# Patient Record
Sex: Female | Born: 1935 | Race: White | Hispanic: No | Marital: Married
Health system: Southern US, Community
[De-identification: ages and names within clinical notes are randomized; demographics above are authoritative.]

---

## 2003-05-11 ENCOUNTER — Other Ambulatory Visit: Payer: Self-pay

## 2003-08-23 ENCOUNTER — Other Ambulatory Visit: Payer: Self-pay

## 2004-03-22 ENCOUNTER — Ambulatory Visit: Payer: Self-pay | Admitting: Oncology

## 2004-04-04 ENCOUNTER — Ambulatory Visit: Payer: Self-pay | Admitting: Oncology

## 2004-04-16 ENCOUNTER — Ambulatory Visit: Payer: Self-pay

## 2004-08-08 ENCOUNTER — Ambulatory Visit: Payer: Self-pay | Admitting: Internal Medicine

## 2004-09-19 ENCOUNTER — Ambulatory Visit: Payer: Self-pay | Admitting: Oncology

## 2004-10-03 ENCOUNTER — Ambulatory Visit: Payer: Self-pay | Admitting: Oncology

## 2004-12-30 ENCOUNTER — Ambulatory Visit: Payer: Self-pay

## 2005-03-20 ENCOUNTER — Ambulatory Visit: Payer: Self-pay | Admitting: Oncology

## 2005-04-04 ENCOUNTER — Ambulatory Visit: Payer: Self-pay | Admitting: Oncology

## 2005-08-21 ENCOUNTER — Ambulatory Visit: Payer: Self-pay | Admitting: Oncology

## 2005-09-16 ENCOUNTER — Ambulatory Visit: Payer: Self-pay | Admitting: Oncology

## 2005-10-03 ENCOUNTER — Ambulatory Visit: Payer: Self-pay | Admitting: Oncology

## 2006-03-17 ENCOUNTER — Ambulatory Visit: Payer: Self-pay | Admitting: Oncology

## 2006-04-04 ENCOUNTER — Ambulatory Visit: Payer: Self-pay | Admitting: Oncology

## 2006-08-04 ENCOUNTER — Ambulatory Visit: Payer: Self-pay | Admitting: Oncology

## 2006-09-01 ENCOUNTER — Ambulatory Visit: Payer: Self-pay | Admitting: Oncology

## 2006-09-03 ENCOUNTER — Ambulatory Visit: Payer: Self-pay | Admitting: Oncology

## 2006-10-05 ENCOUNTER — Ambulatory Visit: Payer: Self-pay | Admitting: Oncology

## 2007-02-03 ENCOUNTER — Ambulatory Visit: Payer: Self-pay | Admitting: Internal Medicine

## 2007-03-02 ENCOUNTER — Ambulatory Visit: Payer: Self-pay | Admitting: Internal Medicine

## 2007-03-06 ENCOUNTER — Ambulatory Visit: Payer: Self-pay | Admitting: Internal Medicine

## 2007-05-06 ENCOUNTER — Ambulatory Visit: Payer: Self-pay | Admitting: Oncology

## 2007-06-06 ENCOUNTER — Ambulatory Visit: Payer: Self-pay | Admitting: Oncology

## 2007-09-03 ENCOUNTER — Ambulatory Visit: Payer: Self-pay | Admitting: Oncology

## 2007-09-08 ENCOUNTER — Ambulatory Visit: Payer: Self-pay | Admitting: Oncology

## 2007-10-04 ENCOUNTER — Ambulatory Visit: Payer: Self-pay | Admitting: Oncology

## 2007-11-03 ENCOUNTER — Ambulatory Visit: Payer: Self-pay | Admitting: Oncology

## 2008-03-05 ENCOUNTER — Ambulatory Visit: Payer: Self-pay | Admitting: Oncology

## 2008-03-15 ENCOUNTER — Ambulatory Visit: Payer: Self-pay | Admitting: Oncology

## 2008-04-04 ENCOUNTER — Ambulatory Visit: Payer: Self-pay | Admitting: Oncology

## 2008-09-02 ENCOUNTER — Ambulatory Visit: Payer: Self-pay | Admitting: Oncology

## 2008-09-11 ENCOUNTER — Ambulatory Visit: Payer: Self-pay | Admitting: Oncology

## 2008-10-03 ENCOUNTER — Ambulatory Visit: Payer: Self-pay | Admitting: Oncology

## 2008-11-02 ENCOUNTER — Ambulatory Visit: Payer: Self-pay | Admitting: Oncology

## 2009-03-05 ENCOUNTER — Ambulatory Visit: Payer: Self-pay | Admitting: Oncology

## 2009-03-19 ENCOUNTER — Ambulatory Visit: Payer: Self-pay | Admitting: Oncology

## 2009-04-04 ENCOUNTER — Ambulatory Visit: Payer: Self-pay | Admitting: Oncology

## 2009-09-13 ENCOUNTER — Ambulatory Visit: Payer: Self-pay | Admitting: Gastroenterology

## 2009-10-11 ENCOUNTER — Ambulatory Visit: Payer: Self-pay | Admitting: Oncology

## 2009-11-22 ENCOUNTER — Ambulatory Visit: Payer: Self-pay | Admitting: Oncology

## 2009-11-23 LAB — CANCER ANTIGEN 27.29: CA 27.29: 15.2 U/mL

## 2009-12-03 ENCOUNTER — Ambulatory Visit: Payer: Self-pay | Admitting: Oncology

## 2009-12-06 ENCOUNTER — Ambulatory Visit: Payer: Self-pay | Admitting: Oncology

## 2009-12-17 LAB — PATHOLOGY REPORT

## 2009-12-27 ENCOUNTER — Ambulatory Visit: Payer: Self-pay | Admitting: Surgery

## 2010-01-01 ENCOUNTER — Ambulatory Visit: Payer: Self-pay | Admitting: Surgery

## 2010-01-03 ENCOUNTER — Ambulatory Visit: Payer: Self-pay | Admitting: Oncology

## 2010-02-02 ENCOUNTER — Ambulatory Visit: Payer: Self-pay | Admitting: Oncology

## 2010-02-16 LAB — CANCER ANTIGEN 27.29: CA 27.29: 14.3 U/mL (ref 0.0–38.6)

## 2010-03-05 ENCOUNTER — Ambulatory Visit: Payer: Self-pay | Admitting: Oncology

## 2010-03-09 LAB — CANCER ANTIGEN 27.29: CA 27.29: 14.3 U/mL (ref 0.0–38.6)

## 2010-04-04 ENCOUNTER — Ambulatory Visit: Payer: Self-pay | Admitting: Oncology

## 2010-05-03 ENCOUNTER — Inpatient Hospital Stay: Payer: Self-pay | Admitting: Internal Medicine

## 2010-05-05 ENCOUNTER — Ambulatory Visit: Payer: Self-pay | Admitting: Oncology

## 2010-06-05 ENCOUNTER — Ambulatory Visit: Payer: Self-pay | Admitting: Oncology

## 2010-06-20 ENCOUNTER — Ambulatory Visit: Payer: Self-pay | Admitting: Oncology

## 2010-07-04 ENCOUNTER — Ambulatory Visit: Payer: Self-pay | Admitting: Oncology

## 2010-08-04 ENCOUNTER — Ambulatory Visit: Payer: Self-pay | Admitting: Oncology

## 2010-09-03 ENCOUNTER — Ambulatory Visit: Payer: Self-pay | Admitting: Oncology

## 2010-10-04 ENCOUNTER — Ambulatory Visit: Payer: Self-pay | Admitting: Oncology

## 2010-10-29 ENCOUNTER — Ambulatory Visit: Payer: Self-pay | Admitting: Oncology

## 2010-11-03 ENCOUNTER — Ambulatory Visit: Payer: Self-pay | Admitting: Oncology

## 2010-12-20 ENCOUNTER — Ambulatory Visit: Payer: Self-pay | Admitting: Oncology

## 2010-12-21 LAB — CANCER ANTIGEN 27.29: CA 27.29: 17.8 U/mL

## 2011-01-04 ENCOUNTER — Ambulatory Visit: Payer: Self-pay | Admitting: Oncology

## 2011-02-01 LAB — CANCER ANTIGEN 27.29: CA 27.29: 15.8 U/mL (ref 0.0–38.6)

## 2011-02-03 ENCOUNTER — Ambulatory Visit: Payer: Self-pay | Admitting: Oncology

## 2011-04-05 ENCOUNTER — Ambulatory Visit: Payer: Self-pay | Admitting: Oncology

## 2011-04-07 ENCOUNTER — Ambulatory Visit: Payer: Self-pay

## 2011-04-13 ENCOUNTER — Inpatient Hospital Stay: Payer: Self-pay | Admitting: Specialist

## 2011-05-05 ENCOUNTER — Ambulatory Visit: Payer: Self-pay | Admitting: Oncology

## 2011-05-06 ENCOUNTER — Ambulatory Visit: Payer: Self-pay | Admitting: Oncology

## 2011-05-06 LAB — CANCER ANTIGEN 27.29: CA 27.29: 20.5 U/mL (ref 0.0–38.6)

## 2011-06-12 ENCOUNTER — Ambulatory Visit: Payer: Self-pay | Admitting: Oncology

## 2011-06-17 ENCOUNTER — Ambulatory Visit: Payer: Self-pay | Admitting: Oncology

## 2011-06-23 LAB — CBC CANCER CENTER
Basophil #: 0 x10 3/mm (ref 0.0–0.1)
Basophil %: 0.2 %
Eosinophil %: 0.2 %
HCT: 30.7 % — ABNORMAL LOW (ref 35.0–47.0)
HGB: 9.6 g/dL — ABNORMAL LOW (ref 12.0–16.0)
MCH: 25 pg — ABNORMAL LOW (ref 26.0–34.0)
MCHC: 31.4 g/dL — ABNORMAL LOW (ref 32.0–36.0)
MCV: 80 fL (ref 80–100)
Monocyte #: 0.5 x10 3/mm (ref 0.0–0.7)
Monocyte %: 3.5 %
Platelet: 520 x10 3/mm — ABNORMAL HIGH (ref 150–440)
RBC: 3.85 10*6/uL (ref 3.80–5.20)
WBC: 13.3 x10 3/mm — ABNORMAL HIGH (ref 3.6–11.0)

## 2011-06-23 LAB — COMPREHENSIVE METABOLIC PANEL
Albumin: 2.1 g/dL — ABNORMAL LOW (ref 3.4–5.0)
Anion Gap: 13 (ref 7–16)
BUN: 18 mg/dL (ref 7–18)
Chloride: 104 mmol/L (ref 98–107)
Co2: 24 mmol/L (ref 21–32)
EGFR (African American): >60
EGFR (Non-African Amer.): >60
SGOT(AST): 30 U/L (ref 15–37)
SGPT (ALT): 13 U/L

## 2011-06-27 ENCOUNTER — Inpatient Hospital Stay: Payer: Self-pay | Admitting: Internal Medicine

## 2011-06-27 LAB — URINALYSIS, COMPLETE
Bacteria: NONE SEEN
Glucose,UR: NEGATIVE mg/dL (ref 0–75)
Hyaline Cast: 3
Nitrite: NEGATIVE
Ph: 5 (ref 4.5–8.0)
RBC,UR: 3 /HPF (ref 0–5)
Specific Gravity: 1.021 (ref 1.003–1.030)
WBC UR: 11 /HPF (ref 0–5)

## 2011-06-27 LAB — DIFFERENTIAL
Bands: 8 %
Basophil #: 0 10*3/uL (ref 0.0–0.1)
Comment - H1-Com2: NORMAL
Eosinophil %: 0 %
Lymphocyte #: 1.5 10*3/uL (ref 1.0–3.6)
Monocyte %: 0.2 %
Neutrophil %: 95.5 %
Segmented Neutrophils: 89 %

## 2011-06-27 LAB — COMPREHENSIVE METABOLIC PANEL
Albumin: 2.3 g/dL — ABNORMAL LOW (ref 3.4–5.0)
BUN: 32 mg/dL — ABNORMAL HIGH (ref 7–18)
Bilirubin,Total: 0.3 mg/dL (ref 0.2–1.0)
Chloride: 108 mmol/L — ABNORMAL HIGH (ref 98–107)
Co2: 25 mmol/L (ref 21–32)
Creatinine: 0.74 mg/dL (ref 0.60–1.30)
EGFR (Non-African Amer.): >60
Glucose: 157 mg/dL — ABNORMAL HIGH (ref 65–99)
SGOT(AST): 44 U/L — ABNORMAL HIGH (ref 15–37)
SGPT (ALT): 11 U/L — ABNORMAL LOW
Sodium: 146 mmol/L — ABNORMAL HIGH (ref 136–145)
Total Protein: 7 g/dL (ref 6.4–8.2)

## 2011-06-27 LAB — CBC
HCT: 32.7 % — ABNORMAL LOW (ref 35.0–47.0)
HGB: 10.4 g/dL — ABNORMAL LOW (ref 12.0–16.0)
MCHC: 31.7 g/dL — ABNORMAL LOW (ref 32.0–36.0)
RDW: 19.6 % — ABNORMAL HIGH (ref 11.5–14.5)
WBC: 35.6 10*3/uL — ABNORMAL HIGH (ref 3.6–11.0)

## 2011-06-27 LAB — CK TOTAL AND CKMB (NOT AT ARMC)
CK, Total: 145 U/L (ref 21–215)
CK-MB: 0.8 ng/mL (ref 0.5–3.6)

## 2011-06-28 LAB — CBC WITH DIFFERENTIAL/PLATELET
Basophil %: 0 %
Eosinophil %: 0 %
HCT: 24.6 % — ABNORMAL LOW (ref 35.0–47.0)
HGB: 7.8 g/dL — ABNORMAL LOW (ref 12.0–16.0)
Lymphocyte #: 0.2 10*3/uL — ABNORMAL LOW (ref 1.0–3.6)
MCH: 25.3 pg — ABNORMAL LOW (ref 26.0–34.0)
MCV: 80 fL (ref 80–100)
Monocyte #: 0 10*3/uL (ref 0.0–0.7)
Neutrophil #: 16.7 10*3/uL — ABNORMAL HIGH (ref 1.4–6.5)
Platelet: 237 10*3/uL (ref 150–440)
RBC: 3.08 10*6/uL — ABNORMAL LOW (ref 3.80–5.20)
RDW: 19.4 % — ABNORMAL HIGH (ref 11.5–14.5)

## 2011-06-28 LAB — BASIC METABOLIC PANEL
BUN: 26 mg/dL — ABNORMAL HIGH (ref 7–18)
Creatinine: 0.69 mg/dL (ref 0.60–1.30)
EGFR (Non-African Amer.): >60
Glucose: 222 mg/dL — ABNORMAL HIGH (ref 65–99)
Osmolality: 302 (ref 275–301)
Potassium: 4.7 mmol/L (ref 3.5–5.1)
Sodium: 146 mmol/L — ABNORMAL HIGH (ref 136–145)

## 2011-06-29 LAB — CBC WITH DIFFERENTIAL/PLATELET
Eosinophil #: 0 10*3/uL (ref 0.0–0.7)
HCT: 23.5 % — ABNORMAL LOW (ref 35.0–47.0)
HGB: 7.4 g/dL — ABNORMAL LOW (ref 12.0–16.0)
Lymphocyte %: 14 %
Monocyte %: 0.6 %
Neutrophil #: 4.7 10*3/uL (ref 1.4–6.5)
Neutrophil %: 85.1 %
RDW: 19.2 % — ABNORMAL HIGH (ref 11.5–14.5)
WBC: 5.5 10*3/uL (ref 3.6–11.0)

## 2011-07-01 LAB — URINE CULTURE

## 2011-07-02 LAB — CULTURE, BLOOD (SINGLE)

## 2011-07-04 ENCOUNTER — Ambulatory Visit: Payer: Self-pay | Admitting: Oncology

## 2011-08-04 DEATH — deceased

## 2012-03-20 IMAGING — NM NUCLEAR MEDICINE CARDIAC MULTIPLE UPTAKE GATED ACQUISITION SCAN
4 series · 24 of 24 positions shown · non-contrast
Comparison: none

REASON FOR EXAM: breast CA chemo ABRIANYZAN
COMMENTS:

[Series 1000: ant-gated · 3.30mm/px · 6 of 24 frames shown]
[frame 3/24]
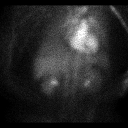
[frame 7/24]
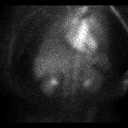
[frame 11/24]
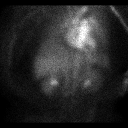
[frame 15/24]
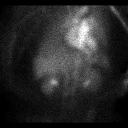
[frame 19/24]
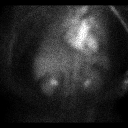
[frame 23/24]
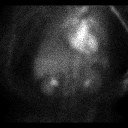

[Series 1000: lao 45-gated (results) · 3.30mm/px · 6 of 24 frames shown]
[frame 3/24]
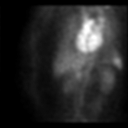
[frame 7/24]
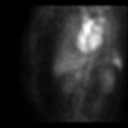
[frame 11/24]
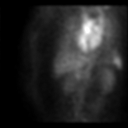
[frame 15/24]
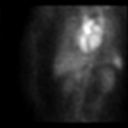
[frame 19/24]
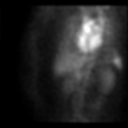
[frame 23/24]
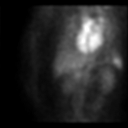

[Series 1000: lao 45-gated · 3.30mm/px · 6 of 24 frames shown]
[frame 3/24]
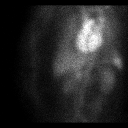
[frame 7/24]
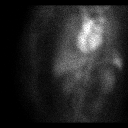
[frame 11/24]
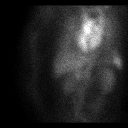
[frame 15/24]
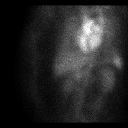
[frame 19/24]
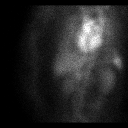
[frame 23/24]
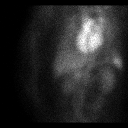

[Series 1000: lao 70-gated · 3.30mm/px · 6 of 24 frames shown]
[frame 3/24]
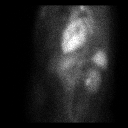
[frame 7/24]
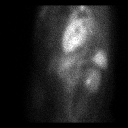
[frame 11/24]
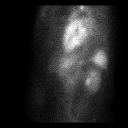
[frame 15/24]
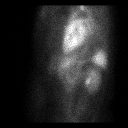
[frame 19/24]
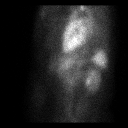
[frame 23/24]
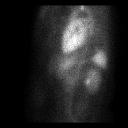

[24 of 24 positions shown; findings below may reference images not displayed]

PROCEDURE:     KNM - KNM REST MUGA SCAN [DATE] OF [DATE]  - [DATE] [DATE] [DATE]  [DATE]

RESULT:     The patient's blood pool was labeled using 23.63 mCi of
technetium 99m labeled pertechnetate with 3 mL of pyrophosphate. The cine
images demonstrated concentric left ventricular wall motion. The ejection
fraction was measured at 56%.
IMPRESSION: Normal left ventricular ejection fraction.

## 2014-08-27 NOTE — Discharge Summary (Signed)
PATIENT NAME:  Annette Watson, Annette Watson MR#:  [id number] DATE OF BIRTH:  [birth year]  DATE OF ADMISSION:  06/27/2011 DATE OF DISCHARGE:  06/30/2011  DISCHARGE DIAGNOSES: 1. Altered mental status with encephalopathy.  2. Leukocytosis.  3. Metastatic breast cancer with pain involving the right chest wall and upper extremity.  4. Type 2 diabetes. 5. Hyperlipidemia.  6. History of previous cerebrovascular accident.   CHIEF COMPLAINT: Talking out of her head.   HISTORY OF PRESENT ILLNESS: Annette Watson is an 81 year old female with a history of metastatic breast cancer who been seen by Dr. Doylene Canninghoksi recently and received an Adriamycin shot. The patient reportedly also had her pain medications changed and her Duragesic was increased to 75 mcg and she was changed to oxycodone instead of Vicodin. The patient reportedly had been sleeping a lot and she also was treated recently for a urinary tract infection. The patient recently had a PET scan that showed a soft tissue mass in the right chest wall with rib involvement. The patient in the ER was noted to have an elevated white cell count of 35,000 with leukocyte esterase in the urinalysis.   PAST MEDICAL HISTORY:  1. Metastatic breast cancer. 2. History of B12 deficiency.  3. Osteoporosis.  4. Gastric polyp. 5. Degenerative disk disease.  6. Hypertension. 7. Prior history of carcinoid. 8. 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  9. Diabetes. 10. Right upper extremity lymphedema. 11. Hyperlipidemia.   PAST SURGICAL HISTORY:  1. Lumpectomy.  2. Appendectomy.  3. Bilateral cataracts.  4. Cholecystectomy. 5. Hysterectomy. 6. Hip surgeries.    PHYSICAL EXAMINATION: VITAL SIGNS: Patient was afebrile, pulse 57, respirations 16, blood pressure 124/57, oxygen saturation 93% on room air. GENERAL: She was not in respiratory distress. However, she appeared to be in significant pain. SKIN: She did have a large decubitus in the  buttock. She had 2+ edema, more so on the right upper extremity. ABDOMEN: Soft, nontender. HEART: S1, S2. LUNGS: Clear to auscultation. NECK: No JVD.   LABORATORY, RADIOLOGICAL AND DIAGNOSTIC DATA: CT scan of the head showed a large area of encephalomalacia in the left cerebrum consistent with old infarct. Hemoglobin 10.4, hematocrit 32.7, troponin was negative. Albumin was low at 2.3, potassium 4.9, sodium 146, creatinine 0.74, BUN 32, glucose 157.   HOSPITAL COURSE: The patient was admitted to [facility] and she was seen in consultation by hospice. She was also seen by hematology/oncology specialist, Dr. Sherrlyn HockPandit. The patient was continued on Fentanyl, Toradol, IV morphine. Discussions were held with the family. She was wanting to be made DO NOT RESUSCITATE, and kept comfortable. She was, therefore, transferred to the hospice home and started on Roxanol for pain along with lorazepam and continued on Duragesic patch and gabapentin. The patient was discharged to the hospice home on the following medications.   DISCHARGE MEDICATIONS:  1. Zofran 4 mg every eight hours for 10 days p.r.n.  2. Meloxicam 15 mg once a day.  3. Amlodipine 5 mg once a day.  4. Lisinopril 40 mg once a day.  5. Glipizide 10 mg b.i.d.  6. Atenolol 50 mg daily.  7. Metformin 500 mg b.i.d.  8. Duragesic patch 50 mcg every 72 hours.  9. Gabapentin 300 mg p.o. t.i.d.  10. Roxanol 20 mg/mL as per standing order. 11. Lorazepam 0.5 to 1 mg p.o. or sublingual every 2 to 4 hours p.r.n. for agitation and anxiety. 12. Prednisone 40 mg taper by 2.5 mg every other day until gone. 13.  NovoLog insulin as per sliding scale.   The patient and family were aware of her overall poor prognosis.    ____________________________ Barbette Reichmann, MD vh:ap D: 07/03/2011 17:26:29 ET T: 07/04/2011 12:03:07 ET JOB#: [id number]  cc: Barbette Reichmann, MD, <Dictator> Barbette Reichmann MD ELECTRONICALLY SIGNED 07/18/2011 13:01

## 2014-08-27 NOTE — Consult Note (Signed)
History of Present Illness:   Reason for Consult Patient was admitted in the hospital with increasing right upper extremity pain.  Patient has progressive carcinoma of breast triple negative disease, failng Abraxane and Adriamycin and radiation therapy    HPI   81 year old lady on a previous history of carcinoma of breast, triple negative disease now progressive disease increasing pain in the right upper extremity.  Patient was admitted with increasing confusion and disorientation.  Poor appetite.  Gradually declining condition.  Patient was on fentanyl patch 75 mcg and oxycodone.  PFSH:   Additional Past Medical and Surgical History Past Medical History  Diabetes controlled with oral medication.  History of carcinoma of appendix.  Hypertension.  Previous history of right sided hemiplegia with residual weakness.  Past Surgical History  Appendectomy for carcinoma.  Surgery for breast cancer.  Family History  No family history of colorectal cancer, breast cancer, or ovarian cancer.  Social History  Does not smoke.  Does not drink.   Review of Systems:   Review of Systems   Gen. status: Patient is in wheelchair.  In distress because of increasing pain in the right shoulder.  Right upper extremity increasing swelling.  Pain is constant dull aching not reviewed by hydrocortisone.  Poor appetite.  Losing weight.  Lungs: Cough and shortness of breath.  Cardiac: No chest pain.  No palpitation.  GI: Poor appetite.  No diarrhea.  Constipation.  GU: No dysuria or hematuria.  Skin: Has noticed multiple new nodules in the chest wall area.  Neurological system: Residual weakness from previous cerebrovascular accident.  Lower extremity no edema.  NURSING NOTES: ED Vital Sign Flow Sheet:   22-Feb-13 06:00    Pulse Pulse: 58    Respirations Respirations: 16    SBP SBP: 132    DBP DBP: 74    Pulse Ox % Pulse Ox %: 93    Source: Room Air   Physical Exam:   Physical Exam Gen. status:  Performance status is 2.LYMPHATICS:   No cervical, axillary, or inguinal lymphadenopathy.  Right upper chest wall area: Multiple new nodules.  Right upper extremity swelling.  No mental the right shoulder is limited.ABDOMINAL:   Positive bowel sounds, nontender, nondistended, soft.  No hepatomegaly, no splenomegaly cardiac: Tachycardia.  Neurological system: Persistent right upper and lower extremity weakness (previous cerebrovascular accident). HEENT:   Normocephalic, atraumatic, eyes anicteric. Mucous membranes moist. No ulcers, lesions, or thrush.  Examination of the remaining 12 systems is noncontributory.     Hip Fracture - Right:    Expressive dysphagia:    Right arm swelling:    Memory changes and confusion:    Swallowing difficulty sometimes:    Diabetes Mellitus, Type II (NIDD):    anemia:    Right breast cancer:    Stroke: 1982   right sided hemiplegia w/residual weakness:    Hypertension:    appendix ca:    cataract surgery both eyes:    Breast Surgery:    Appendectomy:    No Known Allergies:     ondansetron 4 mg tablet: 1 tab(s) orally every 8 hours x 10 days, As Needed- for Nausea, Vomiting , Active, 30, None   oxyCODONE 10 mg tablet: 1 tab(s) orally every 4 hours x 15 days, As Needed- for Pain , Active, 90, None   meloxicam 15 mg tablet: 1 tab(s) orally once a day x 15 days, Active, 15, None   acetaminophen-HYDROcodone 500 mg-5 mg tablet: 1 tab(s) orally every 4 hours x 15  days, As Needed- for Pain , Active, 90, None   amlodipine 5 mg oral tablet: 1  orally once a day (at bedtime) , Active, 0, None   lisinopril 40 mg oral tablet: 1  orally once a day (in the morning) , Active, 0, None   glipiZIDE 10 mg oral tablet: 1  orally 2 times a day , Active, 0, None   simvastatin 40 mg oral tablet: 1  orally once a day (at bedtime) , Active, 0, None   atenolol 50 mg oral tablet: 1 tab(s) orally once a day, Active, 0, None   metformin 500 mg oral tablet: 1  tab(s) orally 2 times a day, Active, 0, None   aspirin 81 mg oral tablet: 1 tab(s) orally once a day, Active, 0, None   Duragesic-50 patch:   every 72 hours, Active, 0, None   gabapentin 300 mg oral capsule: 1 cap(s) orally 3 times a day, Active, 0, None  Cardiac:  22-Feb-13 05:08    CK, Total 145   CPK-MB, Serum 0.8  Routine Chem:  22-Feb-13 05:08    Glucose, Serum 157   BUN 32   Creatinine (comp) 0.74   Sodium, Serum 146   Potassium, Serum 4.9   Chloride, Serum 108   CO2, Serum 25   Calcium (Total), Serum 8.7  Hepatic:  22-Feb-13 05:08    Bilirubin, Total 0.3   Alkaline Phosphatase 57   SGPT (ALT) 11   SGOT (AST) 44   Total Protein, Serum 7.0   Albumin, Serum 2.3  Routine Chem:  22-Feb-13 05:08    Osmolality (calc) 301   eGFR (African American) >60   eGFR (Non-African American) >60   Anion Gap 13  Routine Hem:  22-Feb-13 05:08    WBC (CBC) 35.6   RBC (CBC) 4.06   Hemoglobin (CBC) 10.4   Hematocrit (CBC) 32.7   Platelet Count (CBC) 344   MCV 81   MCH 25.5   MCHC 31.7   RDW 19.6  Cardiac:  22-Feb-13 05:08    Troponin I < 0.02  Routine Hem:  22-Feb-13 05:08    Neutrophil % 95.5   Lymphocyte % 4.3   Monocyte % 0.2   Eosinophil % 0.0   Basophil % 0.0   Neutrophil # 34.0   Lymphocyte # 1.5   Monocyte # 0.1   Eosinophil # 0.0   Basophil # 0.0   Reference Accession# [id number]   Bands 8   Segmented Neutrophils 89   Lymphocytes 3  Routine UA:  22-Feb-13 05:40    Color (UA) Yellow   Clarity (UA) Hazy   Glucose (UA) Negative   Bilirubin (UA) Negative   Ketones (UA) Negative   Specific Gravity (UA) 1.021   Blood (UA) Negative   pH (UA) 5.0   Protein (UA) Negative   Nitrite (UA) Negative   Leukocyte Esterase (UA) Trace   RBC (UA) 3 /HPF   WBC (UA) 11 /HPF   Epithelial Cells (UA) <1 /HPF   Mucous (UA) PRESENT   Budding Yeast (UA) PRESENT   Hyaline Cast (UA) 3 /LPF  Blood Glucose:  22-Feb-13 11:41    POCT Blood Glucose 190    16:33    POCT  Blood Glucose 182    20:16    POCT Blood Glucose 214   Assessment and Plan:  Impression:   1.  Carcinoma of breast recurrent chest wall disease (triple -ve)  increasing pain in the right upper extremity right shoulder.  Multiple new nodules  on the chest  Status post 2nd cycle of Adriamycin chemotherapy without much relief in pain Pain can be due to brachial plexus involvement Confusion and disorientation may be secondary to pain medication versus metastatic disease to brain Discuss with the family.  Will switch to comfort care.  Start IV steroid add Neurontin .  IV Toradol.  Possibility of discharge with hospice next week after pain is controlled.  Discussed with the family and they wanted her to be no code.  Electronic Signatures: Dinah Lupa, Martie Lee (MD)  (Signed [phone] 22:44)  Authored: HISTORY OF PRESENT ILLNESS, PFSH, ROS, NURSING NOTES, PE, PAST MEDICAL HISTORY, ALLERGIES, HOME MEDICATIONS, LABS, ASSESSMENT AND PLAN   Last Updated: 22-Feb-13 22:44 by Jobe Gibbon (MD)

## 2014-08-27 NOTE — H&P (Signed)
PATIENT NAME:  Annette Watson, Annette Watson MR#:  [id number] DATE OF BIRTH:  November 10, 1928  DATE OF ADMISSION:  06/27/2011  PRIMARY CARE PHYSICIAN: Dr. Marcello Fennel  ONCOLOGIST: Dr. Doylene Canning   CHIEF COMPLAINT: Talking out of her head.   HISTORY OF PRESENT ILLNESS: This is an 81 year old female who has been talking out of her head. She had a cancer treatment on Monday with Dr. Doylene Canning, given Adriamycin. On Tuesday received a shot. I am not sure if that is Neupogen or not. As per the husband she has been going downhill since they changed pain medications up to 75 of Duragesic and oxycodone 10 mg instead of Vicodin. She has been sleeping a lot, talking out of her head. She had a recent treatment with a urinary tract infection. The patient is unable to give history secondary to prior history of cerebrovascular accident. She did have a PET scan last week on 06/17/2011 that showed a soft tissue mass right chest wall involvement and rib involvement and that was on 06/17/2011. In the Emergency Room, she was found to be altered mental status, had trace leukocyte esterase on her urinalysis, had an increased white count of 35,000 and hospitalist services were contacted for further evaluation.   PAST MEDICAL HISTORY:  1. Metastatic breast cancer, undergoing chemotherapy status post lumpectomy and radiation in the past. The chemotherapy is for recurrence. 2. History of B12 deficiency.  3. Osteoporosis.  4. Gastric polyp. 5. Degenerative disk disease.  6. Hypertension.  7. Prior history of carcinoid. 8. Cerebrovascular accident with right left-sided paralysis and aphasia. 9. Diabetes.  10. Right upper extremity lymphedema.  11. Hyperlipidemia.   PAST SURGICAL HISTORY:  1. Lumpectomy.  2. Appendectomy.  3. Cataracts bilaterally. 4. Cholecystectomy.  5. Hysterectomy.  6. Hip surgery.   ALLERGIES: No known drug allergies in the computer.   MEDICATIONS:  1. Gabapentin 300 mg t.i.d.  2. Meloxicam 50 mg daily. 3. Duragesic  patch recently increased to 75 mcg daily.  4. Oxycodone 10 mg every four hours p.r.n.  5. Aspirin 81 mg daily. 6. Atenolol 50 mg daily. 7. Lisinopril 40 mg daily. 8. Metformin 500 mg twice a day.  9. Glipizide 10 mg twice a day.  10. Zocor 40 mg daily.  11. Norvasc 5 mg daily.   FAMILY HISTORY: Mother died at old age at 54. Father died at 19 with bone cancer.   SOCIAL HISTORY: Lives with her husband. No smoking, alcohol or drug use   REVIEW OF SYSTEMS: Patient states "no" every review of systems. Unclear if this is true or not. Difficult history with altered mental status and history of aphasia. Going through all the details: CONSTITUTIONAL: No fever, chills, or sweats. EYES: No eye pain. EARS, NOSE, MOUTH, AND THROAT: No hearing loss. No sore throat. No difficulty swallowing. CARDIOVASCULAR: No chest pain. No palpitations. RESPIRATORY: No shortness of breath. No cough. No wheezing. No hemoptysis. GASTROINTESTINAL: No nausea. No vomiting. No abdominal pain, no diarrhea, no constipation, no bright red blood per rectum. No melena. GENITOURINARY: No burning on urination. No hematuria. MUSCULOSKELETAL: No joint pain but as per the husband right arm pain and right chest wall pain. INTEGUMENT: No rashes. NEUROLOGIC: No fainting. PSYCHIATRIC: No anxiety or depression. ENDOCRINE: No thyroid problems. HEMATOLOGIC/LYMPHATIC: History of anemia.     PHYSICAL EXAMINATION:  VITAL SIGNS: Temperature 98.9, pulse 57, respirations 16, blood pressure 124/57, pulse oximetry 93% on room air.   GENERAL: No respiratory distress.   EYES: Conjunctivae and lids normal. Pupils equal, round, and  reactive to light. Able to track my finger with her eyes in all directions.   EARS, NOSE, MOUTH, AND THROAT: Tympanic membranes obscured by wax. Nasal mucosa no erythema. Throat no erythema. No exudate seen. Lips and gums no lesions.   NECK: No JVD. No bruits. No lymphadenopathy. No thyromegaly. No thyroid nodules palpated.    RESPIRATORY: Lungs clear to auscultation No use of accessory muscles to breathe.   CARDIOVASCULAR: S1, S2 normal. No gallops, rubs, or murmurs heard. Carotid upstroke 2+ bilaterally. No bruits.   EXTREMITIES: Dorsalis pedis pulses 2+ bilaterally. Trace edema of the lower extremity.   ABDOMEN: Soft, nontender. No organomegaly/splenomegaly. Normoactive bowel sounds. No masses felt.   LYMPHATIC: No lymph nodes in the neck.   MUSCULOSKELETAL: No clubbing. 2+ edema. No cyanosis.   SKIN: On the buttock she does have a large area that is regular decubitus with some blackish material.   NEUROLOGIC: Cranial nerves II through XII grossly intact. Deep tendon reflexes 1+. Patient is paralyzed on the right side. Able to move extremities on the left side on her own.   PSYCHIATRIC: Answers yes or no questions.   LABORATORY, DIAGNOSTIC AND RADIOLOGICAL DATA: Urinalysis: Trace leukocyte esterase. Glucose 157, BUN 32, creatinine 0.74, sodium 146, potassium 4.9, chloride 108, CO2 25. Liver function tests normal. Albumin low at 23. White blood cell count 35.6, platelet count 344, hemoglobin and hematocrit 10.4 and 32.7. Troponin negative. Chest x-ray looks like a chronic area in the right upper chest. CT scan of the head showed no acute intracranial abnormality. Large area of encephalomalacia in the left cerebrum consistent with old infarct. No EKG done.   ASSESSMENT AND PLAN:  1. Encephalopathy with altered mental status, possibly secondary to change in pain medications recently. Will hold the oxycodone for right now and the gabapentin. Will decrease the Duragesic patch from 75 back down to 50, p.r.n. Vicodin only for pain. Need to rule out metastatic disease to the brain. Will get an MRI of the brain. Could also be infection related with leukocytosis, unclear at this point. The urinalysis only trace leukocyte esterase.  2. Leukocytosis. Need to rule out infection. Will get blood cultures x2. Urine culture.  Empiric Zosyn for right now, one dose of vancomycin until cultures are back.  3. Metastatic breast cancer. Patient is a FULL CODE. Will consult Dr. Doylene Canninghoksi to discuss prognosis.  4. Hypertension. Blood pressure currently stable on usual medications, will continue.  5. Diabetes. With the altered mental status will get a swallow evaluation since I am not sure how much she will eat right now. Will hold the oral medications and just cover with sliding scale.  6. Hyperlipidemia. Continue Zocor.  7. History of cerebrovascular accident. Continue aspirin.  8. Decubitus. Will get a nursing wound consult.   TIME SPENT ON ADMISSION: 55 minutes.   CODE STATUS: FULL CODE.   ____________________________ Herschell Dimesichard J. Renae GlossWieting, MD rjw:cms D: 06/27/2011 08:43:09 ET T: 06/27/2011 09:02:28 ET JOB#: [id number]  cc: Herschell Dimesichard J. Renae GlossWieting, MD, <Dictator> Barbette ReichmannVishwanath Hande, MD Gerome SamJanak K. Doylene Canninghoksi, MD Salley ScarletICHARD J Shirley Bolle MD ELECTRONICALLY SIGNED 06/27/2011 16:05

## 2014-08-27 NOTE — Consult Note (Signed)
ONCOLOGY followup - feels weak, resting in bed, awake and converses appropriately. States pain is better controlled. Hb is 7.4, denies progressive fatigue at rest. no fever. Poor PO intake. awake, weak, NAD           vitals - stable           lungs - decreased BS at bases           abd - soft.Hb 7.4, platelets 223, WBC 5.5, blood c/s negative.  1.  Carcinoma of breast recurrent chest wall disease (triple -ve), new chest nodules - pain is better controlled. Continue fentanyl 50 mcg/hr patch, toradol q8H, IV morphine prn. Possibility of discharge with hospice next week after pain is controlled. Confusion and disorientation may be secondary to pain medication versus metastatic disease to brain - better, continue decadron, supportive treatment.Leucocytosis  - better, also was likely secondary to steroid effect. On IV Zosyn. No fevers.Anemia - per d/w patient and husband she does not want to pursue transfusion since she is nonambulatory and is inclining towards hospice. Will discontinue lab monitoring. Code status - patient is DNR.  Electronic Signatures: Izola PricePandit, Amulya Quintin Raj (MD)  (Signed on 24-Feb-13 17:16)  Authored  Last Updated: 24-Feb-13 17:16 by Izola PricePandit, Hank Walling Raj (MD)

## 2014-08-27 NOTE — Consult Note (Signed)
ONCOLOGY followup note - resting in bed, awake and converses appropriately. States pain is better controlled. Nonambulatory, denies progressive fatigue at rest. no fever. Eating fair.awake and oriented to self, place and person. Weak, NAD           vitals - stable, 95% on RA           lungs - decreased BS at bases           abd - soft.Hb 7.8, platelets 237, WBC 16.9, Cr 0.69, blood c/s negative.  1.  Carcinoma of breast recurrent chest wall disease (triple -ve), new chest nodules - pain is better controlled. Continue fentanyl 50 mcg/hr patch, toradol q8H, IV morphine prn. Possibility of discharge with hospice next week after pain is controlled. Confusion and disorientation may be secondary to pain medication versus metastatic disease to brain - better, continue decadron, supportive treatment.Leucocytosis  - likely secondary to steroid effect. On IV Zosyn. No fevers.Anemia - Nonambulatory, consider blood transfusion if progressive fatigue.Code status - patient is DNR.   Electronic Signatures: Izola PricePandit, Juli Odom Raj (MD)  (Signed on 23-Feb-13 21:07)  Authored  Last Updated: 23-Feb-13 21:07 by Izola PricePandit, Loralye Loberg Raj (MD)
